# Patient Record
Sex: Female | Born: 1985 | Race: White | Hispanic: No | Marital: Married | Smoking: Current every day smoker
Health system: Southern US, Community
[De-identification: ages and names within clinical notes are randomized; demographics above are authoritative.]

## PROBLEM LIST (undated history)

## (undated) DIAGNOSIS — J45909 Unspecified asthma, uncomplicated: Secondary | ICD-10-CM

## (undated) DIAGNOSIS — N83209 Unspecified ovarian cyst, unspecified side: Secondary | ICD-10-CM

## (undated) DIAGNOSIS — F32A Depression, unspecified: Secondary | ICD-10-CM

## (undated) DIAGNOSIS — E282 Polycystic ovarian syndrome: Secondary | ICD-10-CM

## (undated) DIAGNOSIS — E669 Obesity, unspecified: Secondary | ICD-10-CM

## (undated) DIAGNOSIS — G43909 Migraine, unspecified, not intractable, without status migrainosus: Secondary | ICD-10-CM

## (undated) DIAGNOSIS — Z720 Tobacco use: Secondary | ICD-10-CM

## (undated) HISTORY — DX: Unspecified ovarian cyst, unspecified side: N83.209

## (undated) HISTORY — DX: Unspecified asthma, uncomplicated: J45.909

## (undated) HISTORY — DX: Obesity, unspecified: E66.9

## (undated) HISTORY — DX: Migraine, unspecified, not intractable, without status migrainosus: G43.909

## (undated) HISTORY — DX: Tobacco use: Z72.0

## (undated) HISTORY — DX: Polycystic ovarian syndrome: E28.2

## (undated) HISTORY — DX: Depression, unspecified: F32.A

---
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
## 2006-09-20 DIAGNOSIS — F329 Major depressive disorder, single episode, unspecified: Secondary | ICD-10-CM | POA: Insufficient documentation

## 2006-09-20 DIAGNOSIS — N83209 Unspecified ovarian cyst, unspecified side: Secondary | ICD-10-CM | POA: Insufficient documentation

## 2006-09-20 DIAGNOSIS — F3289 Other specified depressive episodes: Secondary | ICD-10-CM | POA: Insufficient documentation

## 2006-09-20 DIAGNOSIS — J309 Allergic rhinitis, unspecified: Secondary | ICD-10-CM | POA: Insufficient documentation

## 2006-09-20 DIAGNOSIS — J45909 Unspecified asthma, uncomplicated: Secondary | ICD-10-CM | POA: Insufficient documentation

## 2006-09-20 DIAGNOSIS — E669 Obesity, unspecified: Secondary | ICD-10-CM | POA: Insufficient documentation

## 2006-09-20 DIAGNOSIS — G43909 Migraine, unspecified, not intractable, without status migrainosus: Secondary | ICD-10-CM | POA: Insufficient documentation

## 2006-09-20 DIAGNOSIS — F17200 Nicotine dependence, unspecified, uncomplicated: Secondary | ICD-10-CM | POA: Insufficient documentation

## 2006-09-21 ENCOUNTER — Encounter (INDEPENDENT_AMBULATORY_CARE_PROVIDER_SITE_OTHER): Payer: Self-pay | Admitting: *Deleted

## 2006-11-13 ENCOUNTER — Emergency Department (HOSPITAL_COMMUNITY): Admission: EM | Admit: 2006-11-13 | Discharge: 2006-11-13 | Payer: Self-pay | Admitting: Emergency Medicine

## 2007-01-15 ENCOUNTER — Emergency Department (HOSPITAL_COMMUNITY): Admission: EM | Admit: 2007-01-15 | Discharge: 2007-01-15 | Payer: Self-pay | Admitting: Family Medicine

## 2007-02-27 ENCOUNTER — Emergency Department (HOSPITAL_COMMUNITY): Admission: EM | Admit: 2007-02-27 | Discharge: 2007-02-27 | Payer: Self-pay | Admitting: Family Medicine

## 2007-07-20 ENCOUNTER — Emergency Department (HOSPITAL_COMMUNITY): Admission: EM | Admit: 2007-07-20 | Discharge: 2007-07-20 | Payer: Self-pay | Admitting: Emergency Medicine

## 2007-07-25 ENCOUNTER — Emergency Department (HOSPITAL_COMMUNITY): Admission: EM | Admit: 2007-07-25 | Discharge: 2007-07-25 | Payer: Self-pay | Admitting: Emergency Medicine

## 2007-07-29 ENCOUNTER — Emergency Department (HOSPITAL_COMMUNITY): Admission: EM | Admit: 2007-07-29 | Discharge: 2007-07-29 | Payer: Self-pay | Admitting: Emergency Medicine

## 2007-08-28 ENCOUNTER — Ambulatory Visit: Payer: Self-pay | Admitting: Cardiology

## 2007-09-13 ENCOUNTER — Emergency Department (HOSPITAL_COMMUNITY): Admission: EM | Admit: 2007-09-13 | Discharge: 2007-09-13 | Payer: Self-pay | Admitting: Family Medicine

## 2007-09-18 ENCOUNTER — Ambulatory Visit: Payer: Self-pay | Admitting: Cardiology

## 2007-09-18 ENCOUNTER — Ambulatory Visit: Payer: Self-pay

## 2007-09-18 ENCOUNTER — Encounter: Payer: Self-pay | Admitting: Cardiology

## 2009-05-29 ENCOUNTER — Emergency Department (HOSPITAL_COMMUNITY): Admission: EM | Admit: 2009-05-29 | Discharge: 2009-05-29 | Payer: Self-pay | Admitting: Family Medicine

## 2009-08-19 ENCOUNTER — Emergency Department (HOSPITAL_COMMUNITY): Admission: EM | Admit: 2009-08-19 | Discharge: 2009-08-19 | Payer: Self-pay | Admitting: Emergency Medicine

## 2009-11-16 ENCOUNTER — Emergency Department: Payer: Self-pay | Admitting: Emergency Medicine

## 2010-10-26 LAB — POCT PREGNANCY, URINE: Preg Test, Ur: NEGATIVE

## 2010-10-26 LAB — WET PREP, GENITAL
WBC, Wet Prep HPF POC: NONE SEEN
Yeast Wet Prep HPF POC: NONE SEEN

## 2010-10-26 LAB — POCT URINALYSIS DIP (DEVICE)
Bilirubin Urine: NEGATIVE
Glucose, UA: NEGATIVE mg/dL
Ketones, ur: NEGATIVE mg/dL
pH: 6.5 (ref 5.0–8.0)

## 2010-10-26 LAB — GC/CHLAMYDIA PROBE AMP, GENITAL
Chlamydia, DNA Probe: NEGATIVE
GC Probe Amp, Genital: NEGATIVE

## 2010-12-06 NOTE — Letter (Signed)
August 28, 2007    Estanislado Pandy, MD  [facility]  [address]  [place], [place] [address]   RE:  ARELLY, WHITTENBERG  MRN:  [id number]  /  DOB:  1986-01-01   Dear Dr. Nash Shearer:   Thank you for your referral of Ms. Lizaola.  As you know, she is a  pleasant 25 year old woman with a reported history of seizures and also  migraine headaches.  She has had 2 fairly recent spells, possibly  seizures, although the diagnosis remains in question, and was referred  for an EEG which was done back in January.  This study revealed no  obvious seizure-like activity.  She was noted on electrocardiographic  tracing to have occasional premature ventricular complexes with what I  presume to be compensatory pauses described as being 1-2 seconds.  She  is referred today to discuss the situation further.   In speaking with the patient in some detail about her events, she seems  to indicate that she has had episodes of fainting even back as early as  age 16.  She remembers an episode where she passed out in a doctor's  office during a blood draw at age 80, and subsequent episode at age 7  when she was standing at work.  She remembers 4 episodes subsequent to  that, the first of which occurred several years ago when she was in a  hospital getting blood drawn.  A second episode occurred when she was  standing in a parking lot speaking with someone and felt lightheaded.  The third episode occurred most recently when she was in the bathroom  seated changing a tampon and noted that her IUD came out.  She was  shocked when she saw this and subsequently began to feel very warm and  flushed all over, subsequently disoriented and lightheaded, and  apparently passed out.  She reports similar feelings with her other  episodes always describing a feeling of warmth followed by  lightheadedness, and also since her heart speed up at this time.  The  most recent episode occurred apparently  when she was lying down in bed  and she does not remember much about the details.  She otherwise has no  exertional chest pain or breathlessness, and no major sense of  palpitations.  Electrocardiogram shows an RSR prime (incomplete right  bundle branch block) and otherwise nonspecific T-wave changes.  There is  no old tracing for comparison.   The patient has no drug allergies.   She is on no regular medications with the exception of meloxicam 7.5 mg  1-2 tablets p.o. p.r.n.   PAST HISTORY:  As outlined above.  She has a cesarean section and also  remotely tonsillectomy.   SOCIAL HISTORY:  The patient is single.  She has two children.  She  works as a Public librarian.  She has a half-pack per day tobacco use  history approximately 10 years.  Denies any recreational drug use or  alcohol use.  Does drink one caffeinated beverage daily.   FAMILY HISTORY:  Reviewed and noncontributory for premature  cardiovascular disease or sudden cardiac death.   REVIEW OF SYSTEMS:  As outlined above.  Otherwise negative.   EXAMINATION:  The patient is comfortable in no acute distress.  Weight is 176 pounds, blood pressure 124/74, heart rate 77.  She is an overweight woman no acute distress.  HEENT:  Conjunctiva lids normal.  Pharynx clear.  Neck is supple.  No  elevated was pressure no loud bruits or thyromegaly.  LUNGS:  Clear.  Without labored breathing at rest.  CARDIOVASCULAR:  Regular rate and rhythm.  Soft probable functional  cardiac murmur at the base, normal second heart sound.  No S3 gallop or  pericardial rub.  ABDOMEN:  Soft.  Nontender.  Normoactive bowel sounds.  EXTREMITIES:  No pitting edema.  Pulses 2+.  SKIN:  Warm and dry.  MUSCULOSKELETAL:  No kyphosis noted.  Neuropsychiatric the patient is alert x3.  Affect is appropriate.   IMPRESSION/RECOMMENDATIONS:  Ms. Eastland is a pleasant 25 year old woman  describing spells over time that actually seen fairly consistent with   neurocardiogenic syncope.  It is entirely possible that she has with  these fainting spells subsequent seizure-like activity associated with  low blood pressure and decreased cerebral perfusion.  Most episodes seem  consistent with this with the exception of the most recent one when she  was supine without obvious trigger.  I suspect that the PVCs noted  during electro-encephalopathy may be clinically insignificant and not  related to the present situation.  She has a probable functional cardiac  murmur.  Electrocardiogram does show an incomplete right bundle branch  block pattern which may well be a normal variant.  She had no other  cardiac evaluation.  Today we spoke about neurocardiogenic syncope and  importance of maintaining a well-hydrated status, and trying to achieve  a supine position if she feels symptoms starting.  I would like to  provide an event recorder to make sure that there are no specific  dysrhythmias of concern and also a basic 2-D echocardiogram to exclude  cardiac structural abnormalities.  I think that if these are both  reassuring she more than likely has neurocardiogenic syncope which can  be managed with conservative measures.  I did not feel strongly about  medications such as beta blockers or selective serotonin reuptake  inhibitors at this point, although if she has progressive recurrent  symptoms, these could be considered.  A tilt table test could also be  considered although the patient's history is fairly suggestive in and of  itself.  I will have her return to the office to discuss results were  testing.    Sincerely,      Jonelle Sidle, MD  Electronically Signed    SGM/MedQ  DD: 08/28/2007  DT: 08/29/2007  Job #: [id number]

## 2010-12-09 NOTE — Consult Note (Signed)
[place]  Patient:    Meghan Spencer, Meghan Spencer Visit Number: [id number] MRN: [id number]          Service Type: Attending:  Kristen Loader, M.D. Dictated by:   Kristen Loader, M.D. Proc. Date: 12/24/01                            Consultation Report  HISTORY OF PRESENT ILLNESS:  The patient is a 25 year old white female, nulligravida, who has come in after multiple visits to other institutions. Her last sonogram was a year ago and she went to another clinic for pelvic pain where they diagnosed ovarian cyst and a possible paraovarian cyst.  The patient has a long history of migraine headaches, specifically on the right side that often cause her to miss school for three days at a time.  She also has a long history of upper respiratory problems, allergic rhinitis, and congestion.  She is currently taking Zyrtec, Entex, and occasionally takes Imitrex.  She is on Depo-Provera for contraception and has for short periods taken oral contraceptives in the past.  PHYSICAL EXAMINATION:  VITAL SIGNS:  She had a blood pressure of 115/62, pulse of 87 per minute, temperature was 98.8.  Her weight is 199 pounds.  GENERAL:  She is a well-developed, well-nourished white female in no apparent acute distress.  HEENT:  Within normal limits.  The ear drums did not show any signs of infection, although the patient has been complaining of earaches recently.  NECK:  Supple.  No masses.  Thyroid normal.  LUNGS:  Clear to auscultation and percussion.  HEART:  No murmur.  Normal sinus rhythm.  ABDOMEN:  Soft, flat, nontender.  No masses or organomegaly.  GENITALIA:  Within normal limits.  EXTREMITIES:  Normal.  DISPOSITION:  I had a long consultation with the patient and her mother concerning her migraines in which we will represcribe the Imitrex.  She has often gone to the hospital for Imitrex injections for rescue.  We will give her a prescription for that.  If she  seems to do well on the Zyrtec for allergies when she takes it regularly, but she has not normally been taking it regularly.  We will renew her prescription for the Entex [place].  The main concern is the patients pelvic masses which have not been evaluated since September 2002 and we will repeat the pelvic sonogram and decide on the appropriate treatment by that time.  The patient is due for a Depo-Provera in two weeks and we will have her return for that visit.  IMPRESSION: 1. Pelvic masses and pelvic pain, chronic and intermittent. 2. Frequent migraine headaches two x every month. 3. Allergic rhinitis. 4. Allergic congestion. Dictated by:   Kristen Loader, M.D. Attending:  Kristen Loader, M.D. DD:  12/24/01 TD:  12/25/01 Job: [id number]

## 2011-04-14 LAB — WET PREP, GENITAL
Trich, Wet Prep: NONE SEEN
Yeast Wet Prep HPF POC: NONE SEEN

## 2011-04-14 LAB — POCT URINALYSIS DIP (DEVICE)
Bilirubin Urine: NEGATIVE
Ketones, ur: NEGATIVE
Protein, ur: NEGATIVE
Specific Gravity, Urine: 1.01
pH: 6.5

## 2011-04-14 LAB — POCT PREGNANCY, URINE: Operator id: 116391

## 2011-04-28 LAB — CBC
MCHC: 34
MCV: 88.9
Platelets: 271
RBC: 4.19
WBC: 10.7 — ABNORMAL HIGH

## 2011-04-28 LAB — DIFFERENTIAL
Basophils Relative: 0
Eosinophils Absolute: 0.1
Neutro Abs: 7.2
Neutrophils Relative %: 68

## 2011-04-28 LAB — I-STAT 8, (EC8 V) (CONVERTED LAB)
Acid-Base Excess: 1
Bicarbonate: 23.7
Chloride: 108
TCO2: 25
pCO2, Ven: 30.4 — ABNORMAL LOW
pH, Ven: 7.5 — ABNORMAL HIGH

## 2011-04-28 LAB — URINALYSIS, ROUTINE W REFLEX MICROSCOPIC
Bilirubin Urine: NEGATIVE
Ketones, ur: NEGATIVE
Leukocytes, UA: NEGATIVE
Nitrite: NEGATIVE
Protein, ur: NEGATIVE
Urobilinogen, UA: 0.2

## 2011-04-28 LAB — POCT PREGNANCY, URINE
Operator id: 26520
Preg Test, Ur: NEGATIVE

## 2011-04-28 LAB — WET PREP, GENITAL
Clue Cells Wet Prep HPF POC: NONE SEEN
Trich, Wet Prep: NONE SEEN

## 2011-04-28 LAB — POCT I-STAT CREATININE
Creatinine, Ser: 0.7
Operator id: 257131

## 2011-05-08 LAB — POCT URINALYSIS DIP (DEVICE)
Bilirubin Urine: NEGATIVE
Glucose, UA: NEGATIVE
Hgb urine dipstick: NEGATIVE
Ketones, ur: NEGATIVE
Nitrite: NEGATIVE

## 2011-05-08 LAB — WET PREP, GENITAL
Clue Cells Wet Prep HPF POC: NONE SEEN
Trich, Wet Prep: NONE SEEN

## 2011-05-08 LAB — GC/CHLAMYDIA PROBE AMP, GENITAL: Chlamydia, DNA Probe: NEGATIVE

## 2011-05-08 LAB — POCT PREGNANCY, URINE: Preg Test, Ur: NEGATIVE

## 2011-07-19 ENCOUNTER — Encounter: Payer: Self-pay | Admitting: Emergency Medicine

## 2011-07-19 ENCOUNTER — Emergency Department (INDEPENDENT_AMBULATORY_CARE_PROVIDER_SITE_OTHER)
Admission: EM | Admit: 2011-07-19 | Discharge: 2011-07-19 | Disposition: A | Discharge status: Home / Self Care | Payer: Self-pay | Source: Home / Self Care | Attending: Family Medicine | Admitting: Family Medicine

## 2011-07-19 DIAGNOSIS — J020 Streptococcal pharyngitis: Secondary | ICD-10-CM

## 2011-07-19 MED ORDER — IBUPROFEN 600 MG PO TABS: 600.0000 mg | ORAL_TABLET | Freq: Three times a day (TID) | ORAL | Status: AC | PRN

## 2011-07-19 MED ORDER — HYDROCODONE-ACETAMINOPHEN 5-325 MG PO TABS
ORAL_TABLET | ORAL | Status: AC
  Filled 2011-07-19: qty 2

## 2011-07-19 MED ORDER — AMOXICILLIN 500 MG PO CAPS: 500.0000 mg | ORAL_CAPSULE | Freq: Three times a day (TID) | ORAL | Status: AC

## 2011-07-19 MED ORDER — ONDANSETRON HCL 4 MG PO TABS: 4.0000 mg | ORAL_TABLET | Freq: Three times a day (TID) | ORAL | Status: AC | PRN

## 2011-07-19 MED ORDER — HYDROCODONE-ACETAMINOPHEN 5-325 MG PO TABS
1.0000 | ORAL_TABLET | Freq: Once | ORAL | Status: AC
  Administered 2011-07-19: 1 via ORAL

## 2011-07-19 MED ORDER — CEFTRIAXONE SODIUM 1 G IJ SOLR
INTRAMUSCULAR | Status: AC
  Filled 2011-07-19: qty 10

## 2011-07-19 MED ORDER — PREDNISONE 20 MG PO TABS: ORAL_TABLET | ORAL | Status: DC

## 2011-07-19 MED ORDER — HYDROCODONE-ACETAMINOPHEN 7.5-325 MG/15ML PO SOLN: 15.0000 mL | Freq: Three times a day (TID) | ORAL | Status: AC | PRN

## 2011-07-19 MED ORDER — CEFTRIAXONE SODIUM 1 G IJ SOLR
1.0000 g | Freq: Once | INTRAMUSCULAR | Status: AC
  Administered 2011-07-19: 1 g via INTRAMUSCULAR

## 2011-07-19 NOTE — ED Notes (Signed)
PT HERE WITH SORE THROAT,PAIN WITH SWALLOWING AND INFLAMED UVULA THAT STARTED ON Monday.PT STATES SX HAS PROGRESSED WITH FLU LIKE SX BODY ACHES,CHILLS UNRELIEVED BY SUDAFED,DAYQUIL AND TYLENOL.TEMP 99.2

## 2011-07-19 NOTE — ED Provider Notes (Signed)
History     CSN: [id number]  Arrival date & time 07/19/11  1642   First MD Initiated Contact with Patient 07/19/11 1706      Chief Complaint  Patient presents with  . Sore Throat  . Influenza    (Consider location/radiation/quality/duration/timing/severity/associated sxs/prior treatment) HPI Comments: 25 y/o female smoker here c/o sore throat, congestion, chills, body aches x 3 days. Pain with swallowing, husband with flu symptoms last week. Denies chest pain, or shortness of breath. Nausea, no vomiting or diarrhea, no rash or abdominal pain.   History reviewed. No pertinent past medical history.  Past Surgical History  Procedure Date  . Cesarean section     History reviewed. No pertinent family history.  History  Substance Use Topics  . Smoking status: Current Everyday Smoker  . Smokeless tobacco: Not on file  . Alcohol Use: No    OB History    Grav Para Term Preterm Abortions TAB SAB Ect Mult Living                  Review of Systems  Constitutional: Positive for fever and chills.  HENT: Positive for congestion, sore throat, trouble swallowing and voice change. Negative for ear pain, neck pain and neck stiffness.   Respiratory: Negative for chest tightness and shortness of breath.   Gastrointestinal: Positive for nausea. Negative for vomiting and abdominal pain.  Musculoskeletal: Positive for myalgias and arthralgias.  Skin: Negative for rash.  Neurological: Positive for headaches.    Allergies  Review of patient's allergies indicates no known allergies.  Home Medications   Current Outpatient Rx  Name Route Sig Dispense Refill  . AMOXICILLIN 500 MG PO CAPS Oral Take 1 capsule (500 mg total) by mouth 3 (three) times daily. 21 capsule 0  . HYDROCODONE-ACETAMINOPHEN 7.5-325 MG/15ML PO SOLN Oral Take 15 mLs by mouth every 8 (eight) hours as needed for pain. 120 mL 0  . IBUPROFEN 600 MG PO TABS Oral Take 1 tablet (600 mg total) by mouth every 8 (eight) hours  as needed for pain or fever. 30 tablet 0  . ONDANSETRON HCL 4 MG PO TABS Oral Take 1 tablet (4 mg total) by mouth every 8 (eight) hours as needed for nausea. 10 tablet 0  . PREDNISONE 20 MG PO TABS  2 tabs po daily for 5 days 10 tablet no    BP 129/86  Pulse 100  Temp(Src) 99.2 F (37.3 C) (Tympanic)  Resp 18  SpO2 99%  Physical Exam  Nursing note and vitals reviewed. Constitutional: She is oriented to person, place, and time. She appears well-developed and well-nourished. No distress.  HENT:  Head: Normocephalic and atraumatic.  Right Ear: External ear normal.  Left Ear: External ear normal.       Nasal Congestion with erythema and swelling of nasal turbinates, clear rhinorrhea. Pharyngeal erythema with exudates. No uvula deviation but moderate uvula edema and exudates. No trismus. patent airway. TM's with increased vascular markings and some dullness bilaterally no swelling or bulging   Eyes: Conjunctivae and EOM are normal. Pupils are equal, round, and reactive to light.  Neck: Neck supple.  Cardiovascular: Normal rate, regular rhythm and normal heart sounds.   Pulmonary/Chest: Effort normal and breath sounds normal. No respiratory distress. She has no wheezes. She has no rales. She exhibits no tenderness.  Abdominal: Soft. She exhibits no distension. There is no tenderness.  Lymphadenopathy:    She has cervical adenopathy.  Neurological: She is alert and oriented to person, place,  and time.  Skin: No rash noted.    ED Course  Procedures (including critical care time)  Labs Reviewed  POCT RAPID STREP A ([facility] ONLY) - Abnormal; Notable for the following:    Streptococcus, Group A Screen (Direct) POSITIVE (*)    All other components within normal limits  LAB REPORT - SCANNED   No results found.   1. Streptococcal pharyngitis       MDM  Strep throat with uvulitis. Prescribed prednisone, amoxicillin, hydrocodone, ibuprofen and zofran with close follow up.  Encouraged to quit smoking!        Sharin Grave, MD 07/21/11 1156

## 2011-07-24 ENCOUNTER — Emergency Department (HOSPITAL_COMMUNITY)
Admission: EM | Admit: 2011-07-24 | Discharge: 2011-07-24 | Disposition: A | Discharge status: Home / Self Care | Payer: Self-pay | Attending: Emergency Medicine | Admitting: Emergency Medicine

## 2011-07-24 ENCOUNTER — Encounter (HOSPITAL_COMMUNITY): Payer: Self-pay | Admitting: Emergency Medicine

## 2011-07-24 ENCOUNTER — Emergency Department (HOSPITAL_COMMUNITY): Payer: Self-pay

## 2011-07-24 DIAGNOSIS — F17200 Nicotine dependence, unspecified, uncomplicated: Secondary | ICD-10-CM | POA: Insufficient documentation

## 2011-07-24 DIAGNOSIS — H9209 Otalgia, unspecified ear: Secondary | ICD-10-CM | POA: Insufficient documentation

## 2011-07-24 DIAGNOSIS — J020 Streptococcal pharyngitis: Secondary | ICD-10-CM | POA: Insufficient documentation

## 2011-07-24 DIAGNOSIS — R220 Localized swelling, mass and lump, head: Secondary | ICD-10-CM | POA: Insufficient documentation

## 2011-07-24 DIAGNOSIS — R509 Fever, unspecified: Secondary | ICD-10-CM | POA: Insufficient documentation

## 2011-07-24 DIAGNOSIS — H9201 Otalgia, right ear: Secondary | ICD-10-CM

## 2011-07-24 DIAGNOSIS — Z79899 Other long term (current) drug therapy: Secondary | ICD-10-CM | POA: Insufficient documentation

## 2011-07-24 LAB — POCT I-STAT, CHEM 8
BUN: 8 mg/dL (ref 6–23)
Calcium, Ion: 1.15 mmol/L (ref 1.12–1.32)
HCT: 37 % (ref 36.0–46.0)
Hemoglobin: 12.6 g/dL (ref 12.0–15.0)
Sodium: 138 mEq/L (ref 135–145)
TCO2: 29 mmol/L (ref 0–100)

## 2011-07-24 LAB — MONONUCLEOSIS SCREEN: Mono Screen: NEGATIVE

## 2011-07-24 MED ORDER — SODIUM CHLORIDE 0.9 % IV BOLUS (SEPSIS)
1000.0000 mL | Freq: Once | INTRAVENOUS | Status: AC
  Administered 2011-07-24: 1000 mL via INTRAVENOUS

## 2011-07-24 MED ORDER — MORPHINE SULFATE 4 MG/ML IJ SOLN
4.0000 mg | Freq: Once | INTRAMUSCULAR | Status: DC
  Filled 2011-07-24: qty 1

## 2011-07-24 MED ORDER — IBUPROFEN 800 MG PO TABS: 800.0000 mg | ORAL_TABLET | Freq: Three times a day (TID) | ORAL | Status: DC

## 2011-07-24 MED ORDER — CEPHALEXIN 500 MG PO CAPS: 500.0000 mg | ORAL_CAPSULE | Freq: Four times a day (QID) | ORAL | Status: DC

## 2011-07-24 MED ORDER — PREDNISONE (PAK) 10 MG PO TABS: 10.0000 mg | ORAL_TABLET | Freq: Every day | ORAL | Status: AC

## 2011-07-24 MED ORDER — PSEUDOEPHEDRINE HCL 60 MG PO TABS
60.0000 mg | ORAL_TABLET | ORAL | Status: AC
  Administered 2011-07-24: 60 mg via ORAL
  Filled 2011-07-24: qty 1

## 2011-07-24 MED ORDER — IBUPROFEN 800 MG PO TABS: 800.0000 mg | ORAL_TABLET | Freq: Three times a day (TID) | ORAL | Status: AC

## 2011-07-24 MED ORDER — METHYLPREDNISOLONE SODIUM SUCC 125 MG IJ SOLR
125.0000 mg | Freq: Once | INTRAMUSCULAR | Status: AC
  Administered 2011-07-24: 125 mg via INTRAVENOUS
  Filled 2011-07-24: qty 2

## 2011-07-24 MED ORDER — SODIUM CHLORIDE 0.9 % IV BOLUS (SEPSIS): 1000.0000 mL | Freq: Once | INTRAVENOUS | Status: DC

## 2011-07-24 MED ORDER — HYDROMORPHONE HCL PF 1 MG/ML IJ SOLN
1.0000 mg | Freq: Once | INTRAMUSCULAR | Status: AC
  Administered 2011-07-24: 1 mg via INTRAVENOUS
  Filled 2011-07-24: qty 1

## 2011-07-24 MED ORDER — ACETAMINOPHEN 325 MG PO TABS
650.0000 mg | ORAL_TABLET | Freq: Once | ORAL | Status: AC
  Administered 2011-07-24: 650 mg via ORAL
  Filled 2011-07-24: qty 2

## 2011-07-24 MED ORDER — PERCOCET 5-325 MG PO TABS: 1.0000 | ORAL_TABLET | Freq: Four times a day (QID) | ORAL | Status: AC | PRN

## 2011-07-24 MED ORDER — IOHEXOL 300 MG/ML  SOLN
75.0000 mL | Freq: Once | INTRAMUSCULAR | Status: AC | PRN
  Administered 2011-07-24: 75 mL via INTRAVENOUS

## 2011-07-24 MED ORDER — CEPHALEXIN 500 MG PO CAPS: 500.0000 mg | ORAL_CAPSULE | Freq: Four times a day (QID) | ORAL | Status: AC

## 2011-07-24 MED ORDER — ONDANSETRON HCL 4 MG/2ML IJ SOLN
4.0000 mg | Freq: Once | INTRAMUSCULAR | Status: AC
  Administered 2011-07-24: 4 mg via INTRAVENOUS
  Filled 2011-07-24: qty 2

## 2011-07-24 NOTE — ED Provider Notes (Signed)
History     CSN: [id number]  Arrival date & time 07/24/11  4098   First MD Initiated Contact with Patient 07/24/11 [phone]      Chief Complaint  Patient presents with  . Otalgia    (Consider location/radiation/quality/duration/timing/severity/associated sxs/prior treatment) HPI Comments: Patient reports right sided throat and right ear pain.  Patient has been sick for approximately 8 days, was seen at urgent care 5 days ago and diagnosed with strep pharyngitis and uvulitis.  Patient has been taking amoxicillin and prednisone.  Last night, patient's fevers returned to 101 and she developed severe pain in her right ear and right throat. Had one episode of N/V upon arrival to ED.  Denies SOB, difficulty swallowing.    Patient is a 25 y.o. female presenting with ear pain. The history is provided by the patient.  Otalgia Pertinent negatives include no abdominal pain and no diarrhea.    History reviewed. No pertinent past medical history.  Past Surgical History  Procedure Date  . Cesarean section   . Cesarean section     History reviewed. No pertinent family history.  History  Substance Use Topics  . Smoking status: Current Everyday Smoker  . Smokeless tobacco: Not on file  . Alcohol Use: No    OB History    Grav Para Term Preterm Abortions TAB SAB Ect Mult Living                  Review of Systems  HENT: Positive for ear pain.   Gastrointestinal: Negative for abdominal pain and diarrhea.  Genitourinary: Negative for dysuria and menstrual problem.  All other systems reviewed and are negative.    Allergies  Review of patient's allergies indicates no known allergies.  Home Medications   Current Outpatient Rx  Name Route Sig Dispense Refill  . AMOXICILLIN 500 MG PO CAPS Oral Take 1 capsule (500 mg total) by mouth 3 (three) times daily. 21 capsule 0  . IBUPROFEN 600 MG PO TABS Oral Take 1 tablet (600 mg total) by mouth every 8 (eight) hours as needed for pain or  fever. 30 tablet 0  . PSEUDOEPHEDRINE HCL 240 MG PO TB24 Oral Take 1 tablet by mouth daily.      Marland Kitchen HYDROCODONE-ACETAMINOPHEN 7.5-325 MG/15ML PO SOLN Oral Take 15 mLs by mouth every 8 (eight) hours as needed for pain. 120 mL 0  . ONDANSETRON HCL 4 MG PO TABS Oral Take 1 tablet (4 mg total) by mouth every 8 (eight) hours as needed for nausea. 10 tablet 0    BP 138/102  Pulse 110  Temp(Src) 101.8 F (38.8 C) (Oral)  Resp 20  SpO2 98%  Physical Exam  Constitutional: She is oriented to person, place, and time. She appears well-developed and well-nourished.  HENT:  Head: Normocephalic and atraumatic.  Right Ear: Tympanic membrane is bulging.  Left Ear: Tympanic membrane normal.  Mouth/Throat: Posterior oropharyngeal edema present.       Asymmetry of soft palate, R>L  Neck: Neck supple.  Cardiovascular: Normal rate, regular rhythm and normal heart sounds.   Pulmonary/Chest: Breath sounds normal. No respiratory distress. She has no wheezes. She has no rales. She exhibits no tenderness.  Abdominal: Soft. Bowel sounds are normal. There is no tenderness.  Neurological: She is alert and oriented to person, place, and time.    ED Course  Procedures (including critical care time)  Labs Reviewed  POCT I-STAT, CHEM 8 - Abnormal; Notable for the following:    Glucose, Bld 103 (*)  All other components within normal limits  MONONUCLEOSIS SCREEN  I-STAT, CHEM 8   Ct Soft Tissue Neck W Contrast  07/24/2011  *RADIOLOGY REPORT*  Clinical Data: Pharyngitis.  Positive strep test.  Neck swelling.  CT NECK WITH CONTRAST  Technique:  Multidetector CT imaging of the neck was performed with intravenous contrast.  Contrast: 75mL OMNIPAQUE IOHEXOL 300 MG/ML IV SOLN  Comparison: None.  Findings: Suprahyoid neck:  Bilateral nasopharyngeal adenoidal hypertrophy.  There is eustachian tube dysfunction with bilateral mastoid fluid without obvious coalescence.  No acute sinus fluid. Mild lingual tonsillar  enlargement.  No retropharyngeal or parapharyngeal abscess.  Normal major and minor salivary glands. Moderate edema of the uvula.  No mucosal lesion of the pharynx or tongue.  No superficial cellulitis.  Larynx:  Normal.  Infrahyoid neck:  Normal thyroid.  No infrahyoid masses.  Lymph nodes:  Widespread bilateral level II lymph nodes, some which have a short axis diameter greater than 1 cm (images 49 and 50 of series 3).  No necrosis.  Smaller level III, level IV, level V nodes also appear relatively symmetric.  The findings are most consistent with an inflammatory or reactive disorder, such as strep pharyngitis, or mononucleosis.  It is not possible to exclude neoplastic process such as lymphoma.  Correlate with clinical findings elsewhere.  Upper chest/mediastinum:  No visible upper mediastinal lymph nodes or masses.  Respiratory motion precludes careful evaluation of the lung apices but there is no visible mass.  There is abundant mediastinal fat.  The arch and great vessels are grossly unremarkable.  Additional:  Anatomic alignment of the cervical spine without spondylosis.  No osseous abnormality.  Intracranial compartment appears unremarkable.  IMPRESSION: Bilateral nasopharyngeal adenoidal hypertrophy without retropharyngeal or parapharyngeal abscess.  Edematous uvula. Bilateral mastoid effusions.  Generalized bilateral level II lymphadenopathy.  Findings most consistent with an inflammatory disorder.  See comments above.  In the appropriate clinical setting, lymphoma could have a several appearance.  Original Report Authenticated By: Elsie Stain, M.D.   9:32 AM Patient reports improvement of pain.  Discussed results and plan with patient.  Patient requests cheaper medications if possible because she does not have health insurance.     1. Strep pharyngitis       MDM  Patient with known strep pharyngitis presents to ED with worsening throat pain, right ear pain, and fever.  CT shows  lymphadenopathy and dysfunctioning eustachian tube, likely due to pharyngeal edema.  Patient has been taking amoxicillin, is out of prednisone.  I have given patient instructions to continue amoxicillin, have added keflex for additional coverage (pt requested antibiotic must be $4 list), prednisone, pain medication.  Education given to maintain hydration, continue decongestants, return for worsening symptoms.   Pt verbalizes understanding and agrees with plan.          Rise Patience, [place] 07/24/11 1044

## 2011-07-24 NOTE — ED Notes (Signed)
Right tympanic membrane appears to be bulging.

## 2011-07-24 NOTE — ED Notes (Signed)
Right tympanic membrane

## 2011-07-24 NOTE — ED Notes (Signed)
Pt transported to CT ?

## 2011-07-24 NOTE — ED Notes (Signed)
Pt states she was diagnosed with strep Wednesday.  Not getting better and throat is very swollen.  Pain in right ear.

## 2011-07-24 NOTE — ED Notes (Signed)
Pt states she went to Urgent Care on this past Wednesday for strep and was prescribed amoxicillin.  Pt stated she has not been getting better and started having ear pain last night.

## 2011-07-25 NOTE — ED Provider Notes (Signed)
Medical screening examination/treatment/procedure(s) were performed by non-physician practitioner and as supervising physician I was immediately available for consultation/collaboration.   Ashwin Tibbs W Konya Fauble, MD 07/25/11 0719 

## 2013-10-23 ENCOUNTER — Ambulatory Visit: Payer: Self-pay | Admitting: Obstetrics & Gynecology

## 2013-10-23 LAB — HCG, QUANTITATIVE, PREGNANCY: Beta Hcg, Quant.: <1 m[IU]/mL — ABNORMAL LOW

## 2014-12-28 ENCOUNTER — Ambulatory Visit: Payer: BC Managed Care – PPO | Admitting: Physical Therapy

## 2015-01-05 ENCOUNTER — Ambulatory Visit: Payer: BC Managed Care – PPO | Attending: Obstetrics and Gynecology | Admitting: Physical Therapy

## 2017-02-12 ENCOUNTER — Other Ambulatory Visit: Payer: Self-pay | Admitting: Nurse Practitioner

## 2017-02-12 DIAGNOSIS — N939 Abnormal uterine and vaginal bleeding, unspecified: Secondary | ICD-10-CM

## 2017-02-14 ENCOUNTER — Ambulatory Visit: Payer: BC Managed Care – PPO

## 2020-02-03 NOTE — Progress Notes (Signed)
02/04/2020 9:41 AM   Meghan Spencer 1985-08-19 [id number]  Referring provider: Fayrene Helper, NP [address] [place],  [place] [address] Chief Complaint  Patient presents with  . Hematuria    HPI: Meghan Spencer is a 34 y.o. female with hematuria who is seen for an evaluation and management. She is accompanied by her husband today.  She had a follow up with Orson Eva, NP on 01/19/2020. She had left sided flank pain radiating to her left buttock and down her left leg x 1 week. She has associated numbness and tingling. During the visit she noted that the flank pain had lessened. She was not on her menstrual cycle at the time. She did ave sexual activity 2 days prior to appointment. She has chronic lower back pain, seeing chiropractor.   UA showed 2+ hematuria on dipstick. No microscopic examination was provided.  CT A/P without contrast on 01/20/2020 showed hepatic steatosis. Umbilical hernia. Normal kidneys. Report is available in referral but I am unable to access imaging elsewhere.   She is a current smoker. Patient smokes 1-2 cigarettes a day.  Her grandmother had liver cancer. No other family history of cancer.   Today she feels terrible. Left sided flank pain and back pain is worse.   It continues to radiate down her leg.  She recently started prednisone which made it feel a lot better.  She reports that this is fairly typical of her back pain issues.  She has a history of PCOS. She has some discomfort from time to time.    PMH: Past Medical History:  Diagnosis Date  . Asthma   . Depression   . Migraine   . Obesity   . Ovarian cyst   . PCOS (polycystic ovarian syndrome)   . Tobacco use     Surgical History: Past Surgical History:  Procedure Laterality Date  . CESAREAN SECTION    . CESAREAN SECTION      Home Medications:  Allergies as of 02/04/2020      Reactions   Morphine       Medication List       Accurate as of February 04, 2020  9:41  AM. If you have any questions, ask your nurse or doctor.        STOP taking these medications   SUDAFED 24 HOUR NON-DROWSY 240 MG Tb24 Generic drug: Pseudoephedrine HCl Stopped by: Vanna Scotland, MD     TAKE these medications   atorvastatin 40 MG tablet Commonly known as: LIPITOR Take 40 mg by mouth at bedtime.   ergocalciferol 1.25 MG (50000 UT) capsule Commonly known as: VITAMIN D2 TAKE 1 CAPSULE ORALLY WEEKLY FOR 1 YEAR   hydrOXYzine 10 MG tablet Commonly known as: ATARAX/VISTARIL Take 10 mg by mouth daily as needed.   metFORMIN 500 MG tablet Commonly known as: GLUCOPHAGE Take by mouth.   predniSONE 20 MG tablet Commonly known as: DELTASONE Take 40 mg by mouth daily with breakfast.   spironolactone 50 MG tablet Commonly known as: ALDACTONE Take 50 mg by mouth daily.       Allergies:  Allergies  Allergen Reactions  . Morphine     Family History: No family history on file.  Social History:  reports that she has been smoking. She does not have any smokeless tobacco history on file. She reports that she does not drink alcohol and does not use drugs. Her grandmother had liver cancer. No other family history of cancer.  Physical Exam: BP 140/80   LMP  (LMP Unknown)   Constitutional:  Alert and oriented, No acute distress. HEENT: [place] AT, moist mucus membranes.  Trachea midline, no masses. Cardiovascular: No clubbing, cyanosis, or edema. Respiratory: Normal respiratory effort, no increased work of breathing. Skin: No rashes, bruises or suspicious lesions. Neurologic: Grossly intact, no focal deficits, moving all 4 extremities. Psychiatric: Normal mood and affect.  Urinalysis shows many bacteria, RBC 3-10   Assessment & Plan:    1. Microscopic hematuria UA on 01/19/2020 showed 2+ hematuria on dipstick No concern for infection at time of incident UA today shows many bacteria, RBC 3-10 Discussed AUA guidelines for microscopic hematuria work-up, will  relatively low risk given primarily her age but she is a smoker and thus it is reasonable to pursue work-up at this point in time Non contrast CT on 01/20/2020 is reassuring Discussed a cystoscopy to evaluate. Patient agreed.  RUS to rule out any occult upper tract lesions   [facility] [address], [address] [place], [place] [address] [phone]  I, Theador Hawthorne, am acting as a scribe for Dr. Vanna Scotland.  I have reviewed the above documentation for accuracy and completeness, and I agree with the above.   Vanna Scotland, MD

## 2020-02-04 ENCOUNTER — Encounter: Payer: Self-pay | Admitting: Urology

## 2020-02-04 ENCOUNTER — Encounter (INDEPENDENT_AMBULATORY_CARE_PROVIDER_SITE_OTHER): Payer: Self-pay

## 2020-02-04 ENCOUNTER — Other Ambulatory Visit: Payer: Self-pay

## 2020-02-04 ENCOUNTER — Ambulatory Visit (INDEPENDENT_AMBULATORY_CARE_PROVIDER_SITE_OTHER): Payer: Medicaid Other | Admitting: Urology

## 2020-02-04 VITALS — BP 140/80

## 2020-02-04 DIAGNOSIS — R319 Hematuria, unspecified: Secondary | ICD-10-CM | POA: Diagnosis not present

## 2020-02-04 NOTE — Patient Instructions (Signed)
Cystoscopy Cystoscopy is a procedure that is used to help diagnose and sometimes treat conditions that affect the lower urinary tract. The lower urinary tract includes the bladder and the urethra. The urethra is the tube that drains urine from the bladder. Cystoscopy is done using a thin, tube-shaped instrument with a light and camera at the end (cystoscope). The cystoscope may be hard or flexible, depending on the goal of the procedure. The cystoscope is inserted through the urethra, into the bladder. Cystoscopy may be recommended if you have:  Urinary tract infections that keep coming back.  Blood in the urine (hematuria).  An inability to control when you urinate (urinary incontinence) or an overactive bladder.  Unusual cells found in a urine sample.  A blockage in the urethra, such as a urinary stone.  Painful urination.  An abnormality in the bladder found during an intravenous pyelogram (IVP) or CT scan. Cystoscopy may also be done to remove a sample of tissue to be examined under a microscope (biopsy). What are the risks? Generally, this is a safe procedure. However, problems may occur, including:  Infection.  Bleeding.  What happens during the procedure?  1. You will be given one or more of the following: ? A medicine to numb the area (local anesthetic). 2. The area around the opening of your urethra will be cleaned. 3. The cystoscope will be passed through your urethra into your bladder. 4. Germ-free (sterile) fluid will flow through the cystoscope to fill your bladder. The fluid will stretch your bladder so that your health care provider can clearly examine your bladder walls. 5. Your doctor will look at the urethra and bladder. 6. The cystoscope will be removed The procedure may vary among health care providers  What can I expect after the procedure? After the procedure, it is common to have: 1. Some soreness or pain in your abdomen and urethra. 2. Urinary symptoms.  These include: ? Mild pain or burning when you urinate. Pain should stop within a few minutes after you urinate. This may last for up to 1 week. ? A small amount of blood in your urine for several days. ? Feeling like you need to urinate but producing only a small amount of urine. Follow these instructions at home: General instructions  Return to your normal activities as told by your health care provider.   Do not drive for 24 hours if you were given a sedative during your procedure.  Watch for any blood in your urine. If the amount of blood in your urine increases, call your health care provider.  If a tissue sample was removed for testing (biopsy) during your procedure, it is up to you to get your test results. Ask your health care provider, or the department that is doing the test, when your results will be ready.  Drink enough fluid to keep your urine pale yellow.  Keep all follow-up visits as told by your health care provider. This is important. Contact a health care provider if you:  Have pain that gets worse or does not get better with medicine, especially pain when you urinate.  Have trouble urinating.  Have more blood in your urine. Get help right away if you:  Have blood clots in your urine.  Have abdominal pain.  Have a fever or chills.  Are unable to urinate. Summary  Cystoscopy is a procedure that is used to help diagnose and sometimes treat conditions that affect the lower urinary tract.  Cystoscopy is done using   a thin, tube-shaped instrument with a light and camera at the end.  After the procedure, it is common to have some soreness or pain in your abdomen and urethra.  Watch for any blood in your urine. If the amount of blood in your urine increases, call your health care provider.  If you were prescribed an antibiotic medicine, take it as told by your health care provider. Do not stop taking the antibiotic even if you start to feel better. This  information is not intended to replace advice given to you by your health care provider. Make sure you discuss any questions you have with your health care provider. Document Revised: 07/02/2018 Document Reviewed: 07/02/2018 Elsevier Patient Education  2020 [employer]   

## 2020-02-05 LAB — URINALYSIS, COMPLETE
Bilirubin, UA: NEGATIVE
Glucose, UA: NEGATIVE
Leukocytes,UA: NEGATIVE
Nitrite, UA: NEGATIVE
Specific Gravity, UA: >1.03 — ABNORMAL HIGH (ref 1.005–1.030)
Urobilinogen, Ur: 0.2 mg/dL (ref 0.2–1.0)
pH, UA: 5 (ref 5.0–7.5)

## 2020-02-05 LAB — MICROSCOPIC EXAMINATION: Epithelial Cells (non renal): >10 /hpf — AB (ref 0–10)

## 2020-02-11 ENCOUNTER — Ambulatory Visit
Admission: RE | Admit: 2020-02-11 | Discharge: 2020-02-11 | Disposition: A | Discharge status: Home / Self Care | Payer: Medicaid Other | Source: Ambulatory Visit | Attending: Urology | Admitting: Urology

## 2020-02-11 ENCOUNTER — Other Ambulatory Visit: Payer: Self-pay

## 2020-02-11 DIAGNOSIS — R319 Hematuria, unspecified: Secondary | ICD-10-CM

## 2020-02-12 ENCOUNTER — Telehealth: Payer: Self-pay | Admitting: *Deleted

## 2020-02-12 NOTE — Telephone Encounter (Addendum)
Left patient a VM with details, asked to return call with any questions.   ----- Message from Vanna Scotland, MD sent at 02/12/2020  8:17 AM EDT ----- Kidneys look healthy and normal on CT scan  Vanna Scotland, MD

## 2020-02-17 NOTE — Progress Notes (Signed)
   02/18/2020  CC:  Chief Complaint  Patient presents with  . Cysto    HPI: Meghan Spencer is a 34 y.o. female with microscopic hematuria returns today for a cystoscopy and review of RUS.   She had a follow up with Orson Eva, NP on 01/19/2020. She had left sided flank pain radiating to her left buttock and down her left leg x 1 week. She has associated numbness and tingling. During the visit she noted that the flank pain had lessened. She was not on her menstrual cycle at the time. She did ave sexual activity 2 days prior to appointment. She has chronic lower back pain, seeing chiropractor.   UA showed 2+ hematuria on dipstick. No microscopic examination was provided.  CT A/P without contrast on 01/20/2020 showed hepatic steatosis. Umbilical hernia. Normal kidneys. Report is available in referral but I am unable to access imaging elsewhere.   RUS on 02/11/2020 showed normal ultrasound appearance of the kidneys. Liver appeared slightly echogenic as may be seen with steatosis, suggest correlation with LFTs.  She is a current smoker. Patient smokes 1-2 cigarettes a day.  Her grandmother had liver cancer. No other family history of cancer.   She has a history of PCOS. She has some discomfort from time to time.    Blood pressure (!) 130/85, pulse 84. NED. A&Ox3.   No respiratory distress   Abd soft, NT, ND Normal external genitalia with patent urethral meatus  Cystoscopy Procedure Note  Patient identification was confirmed, informed consent was obtained, and patient was prepped using Betadine solution.  Lidocaine jelly was administered per urethral meatus.    Procedure: - Flexible cystoscope introduced, without any difficulty.   - Thorough search of the bladder revealed:    normal urethral meatus    normal urothelium    no stones    no ulcers     no tumors    no urethral polyps    no trabeculation  - Ureteral orifices were normal in position and  appearance.  Post-Procedure: - Patient tolerated the procedure well  Urinalysis  Shows 3-10 RBC, otherwise negative  Assessment/ Plan:  1. Microscopic hematuria  UA shows 3-10 RBC, otherwise negative. Cysto shows no evidence of disease. Upper tract imaging is reassuring. Recommend persistent microscopic hematuria (if persistent) to be evaluated every 2-3 years and she can be followed by her PCP. Patient was made aware.   Return if symptoms worsen or fail to improve.  Georges Mouse, am acting as a scribe for Dr. Vanna Scotland.  I have reviewed the above documentation for accuracy and completeness, and I agree with the above.   Vanna Scotland, MD

## 2020-02-18 ENCOUNTER — Other Ambulatory Visit: Payer: Self-pay

## 2020-02-18 ENCOUNTER — Ambulatory Visit (INDEPENDENT_AMBULATORY_CARE_PROVIDER_SITE_OTHER): Payer: Medicaid Other | Admitting: Urology

## 2020-02-18 VITALS — BP 130/85 | HR 84

## 2020-02-18 DIAGNOSIS — R319 Hematuria, unspecified: Secondary | ICD-10-CM | POA: Diagnosis not present

## 2020-02-19 LAB — URINALYSIS, COMPLETE
Bilirubin, UA: NEGATIVE
Glucose, UA: NEGATIVE
Ketones, UA: NEGATIVE
Leukocytes,UA: NEGATIVE
Nitrite, UA: NEGATIVE
Protein,UA: NEGATIVE
Specific Gravity, UA: 1.02 (ref 1.005–1.030)
Urobilinogen, Ur: 0.2 mg/dL (ref 0.2–1.0)
pH, UA: 7 (ref 5.0–7.5)

## 2020-02-19 LAB — MICROSCOPIC EXAMINATION

## 2020-02-20 ENCOUNTER — Other Ambulatory Visit: Payer: Self-pay | Admitting: Nurse Practitioner

## 2020-02-20 DIAGNOSIS — M5442 Lumbago with sciatica, left side: Secondary | ICD-10-CM

## 2020-02-25 ENCOUNTER — Other Ambulatory Visit: Payer: Self-pay | Admitting: Urology

## 2020-03-07 ENCOUNTER — Ambulatory Visit
Admission: RE | Admit: 2020-03-07 | Discharge: 2020-03-07 | Disposition: A | Discharge status: Home / Self Care | Payer: Medicaid Other | Source: Ambulatory Visit | Attending: Nurse Practitioner | Admitting: Nurse Practitioner

## 2020-03-07 ENCOUNTER — Encounter: Payer: Self-pay | Admitting: Radiology

## 2020-03-07 DIAGNOSIS — M5442 Lumbago with sciatica, left side: Secondary | ICD-10-CM | POA: Insufficient documentation

## 2020-03-09 ENCOUNTER — Other Ambulatory Visit: Payer: Self-pay | Admitting: Nurse Practitioner

## 2020-03-09 DIAGNOSIS — S32010A Wedge compression fracture of first lumbar vertebra, initial encounter for closed fracture: Secondary | ICD-10-CM

## 2020-03-25 ENCOUNTER — Ambulatory Visit: Payer: Medicaid Other

## 2021-02-04 ENCOUNTER — Other Ambulatory Visit: Payer: Self-pay | Admitting: Nurse Practitioner

## 2021-02-04 DIAGNOSIS — N644 Mastodynia: Secondary | ICD-10-CM

## 2021-02-14 ENCOUNTER — Other Ambulatory Visit: Payer: Self-pay | Admitting: Nurse Practitioner

## 2021-02-14 DIAGNOSIS — N644 Mastodynia: Secondary | ICD-10-CM

## 2021-03-04 ENCOUNTER — Ambulatory Visit
Admission: RE | Admit: 2021-03-04 | Discharge: 2021-03-04 | Disposition: A | Discharge status: Home / Self Care | Payer: Medicaid Other | Source: Ambulatory Visit | Attending: Nurse Practitioner | Admitting: Nurse Practitioner

## 2021-03-04 ENCOUNTER — Other Ambulatory Visit: Payer: Self-pay | Admitting: Nurse Practitioner

## 2021-03-04 ENCOUNTER — Other Ambulatory Visit: Payer: Self-pay

## 2021-03-04 DIAGNOSIS — N644 Mastodynia: Secondary | ICD-10-CM | POA: Diagnosis not present

## 2021-03-04 DIAGNOSIS — N6310 Unspecified lump in the right breast, unspecified quadrant: Secondary | ICD-10-CM

## 2021-03-07 ENCOUNTER — Other Ambulatory Visit: Payer: Self-pay | Admitting: Nurse Practitioner

## 2021-03-07 DIAGNOSIS — R928 Other abnormal and inconclusive findings on diagnostic imaging of breast: Secondary | ICD-10-CM

## 2021-03-07 DIAGNOSIS — N6489 Other specified disorders of breast: Secondary | ICD-10-CM

## 2021-03-09 ENCOUNTER — Other Ambulatory Visit: Payer: Self-pay | Admitting: Nurse Practitioner

## 2021-03-09 DIAGNOSIS — N6489 Other specified disorders of breast: Secondary | ICD-10-CM

## 2021-03-09 DIAGNOSIS — R928 Other abnormal and inconclusive findings on diagnostic imaging of breast: Secondary | ICD-10-CM

## 2021-03-17 ENCOUNTER — Other Ambulatory Visit: Payer: Self-pay

## 2021-03-17 ENCOUNTER — Ambulatory Visit
Admission: RE | Admit: 2021-03-17 | Discharge: 2021-03-17 | Disposition: A | Discharge status: Home / Self Care | Payer: Medicaid Other | Source: Ambulatory Visit | Attending: Nurse Practitioner | Admitting: Nurse Practitioner

## 2021-03-17 DIAGNOSIS — R928 Other abnormal and inconclusive findings on diagnostic imaging of breast: Secondary | ICD-10-CM

## 2021-03-17 DIAGNOSIS — N6489 Other specified disorders of breast: Secondary | ICD-10-CM

## 2021-03-17 HISTORY — PX: BREAST BIOPSY: SHX20

## 2021-03-18 LAB — SURGICAL PATHOLOGY

## 2022-09-29 ENCOUNTER — Other Ambulatory Visit: Payer: Self-pay | Admitting: Nurse Practitioner

## 2022-11-03 ENCOUNTER — Other Ambulatory Visit: Payer: Self-pay | Admitting: Nurse Practitioner

## 2022-11-06 ENCOUNTER — Other Ambulatory Visit: Payer: Self-pay | Admitting: Nurse Practitioner

## 2022-11-06 MED ORDER — CLONAZEPAM 0.5 MG PO TABS: ORAL_TABLET | ORAL | 1 refills | Status: DC

## 2022-12-27 ENCOUNTER — Other Ambulatory Visit: Payer: Self-pay | Admitting: Nurse Practitioner

## 2023-01-13 ENCOUNTER — Other Ambulatory Visit: Payer: Self-pay | Admitting: Family

## 2023-02-16 ENCOUNTER — Other Ambulatory Visit: Payer: Self-pay | Admitting: Nurse Practitioner

## 2023-05-11 ENCOUNTER — Ambulatory Visit: Payer: Medicaid Other | Admitting: Cardiology

## 2023-05-11 ENCOUNTER — Encounter: Payer: Self-pay | Admitting: Cardiology

## 2023-05-11 VITALS — BP 115/70 | HR 65 | Ht 64.0 in | Wt 253.4 lb

## 2023-05-11 DIAGNOSIS — E559 Vitamin D deficiency, unspecified: Secondary | ICD-10-CM | POA: Insufficient documentation

## 2023-05-11 DIAGNOSIS — Z1329 Encounter for screening for other suspected endocrine disorder: Secondary | ICD-10-CM | POA: Diagnosis not present

## 2023-05-11 DIAGNOSIS — R519 Headache, unspecified: Secondary | ICD-10-CM

## 2023-05-11 DIAGNOSIS — E782 Mixed hyperlipidemia: Secondary | ICD-10-CM | POA: Diagnosis not present

## 2023-05-11 DIAGNOSIS — E119 Type 2 diabetes mellitus without complications: Secondary | ICD-10-CM | POA: Diagnosis not present

## 2023-05-11 NOTE — Progress Notes (Signed)
Established Patient Office Visit  Subjective:  Patient ID: Meghan Spencer, female    DOB: Dec 21, 1985  Age: 37 y.o. MRN: [id number]  Chief Complaint  Patient presents with   Follow-up    F/U    Patient in office for regular follow up. Has not been seen since 12/2021. Patient reports current migraine going on day 3, yesterday being the worst day. States she took a muscle relaxer last night which gave her some relief. Prior to today, patient has taken ibuprofen, tylenol and Fioricet. Patient given samples of Ubrelvy or Nurtec in the past but did not try them. Will give additional samples today for her to trial. Patient requesting to be tested for Lupus because of headaches, joint pain and frequent sores in her nose. Will order lab work today.  Patient wanting to wean off Celexa. Will start cutting in half.  Patient fasting today, will order labs.  History of vitamin d deficiency, will check vitamin D level.  Last pap smear 01/13/2022.     No other concerns at this time.   Past Medical History:  Diagnosis Date   Asthma    Depression    Migraine    Obesity    Ovarian cyst    PCOS (polycystic ovarian syndrome)    Tobacco use     Past Surgical History:  Procedure Laterality Date   BREAST BIOPSY Right 03/17/2021   stereo biopsy/ ribbon clip/ path pending   CESAREAN SECTION     CESAREAN SECTION      Social History   Socioeconomic History   Marital status: Married    Spouse name: Not on file   Number of children: Not on file   Years of education: Not on file   Highest education level: Not on file  Occupational History   Not on file  Tobacco Use   Smoking status: Every Day   Smokeless tobacco: Not on file  Substance and Sexual Activity   Alcohol use: No   Drug use: No   Sexual activity: Not on file  Other Topics Concern   Not on file  Social History Narrative   Not on file   Social Determinants of Health   Financial Resource Strain: Not on file  Food  Insecurity: Not on file  Transportation Needs: Not on file  Physical Activity: Not on file  Stress: Not on file  Social Connections: Not on file  Intimate Partner Violence: Not on file    History reviewed. No pertinent family history.  Allergies  Allergen Reactions   Morphine     Review of Systems  Constitutional: Negative.   HENT: Negative.    Eyes: Negative.   Respiratory: Negative.  Negative for shortness of breath.   Cardiovascular: Negative.  Negative for chest pain.  Gastrointestinal: Negative.  Negative for abdominal pain, constipation and diarrhea.  Genitourinary: Negative.   Musculoskeletal:  Positive for joint pain. Negative for myalgias.  Skin: Negative.   Neurological:  Positive for headaches. Negative for dizziness.  Endo/Heme/Allergies: Negative.   All other systems reviewed and are negative.      Objective:   BP 115/70   Pulse 65   Ht 5\' 4"  (1.626 m)   Wt 253 lb 6.4 oz (114.9 kg)   SpO2 98%   BMI 43.50 kg/m   Vitals:   05/11/23 0917  BP: 115/70  Pulse: 65  Height: 5\' 4"  (1.626 m)  Weight: 253 lb 6.4 oz (114.9 kg)  SpO2: 98%  BMI (Calculated): 43.47  Physical Exam Vitals and nursing note reviewed.  Constitutional:      Appearance: Normal appearance. She is normal weight.  HENT:     Head: Normocephalic and atraumatic.     Nose: Nose normal.     Mouth/Throat:     Mouth: Mucous membranes are moist.  Eyes:     Extraocular Movements: Extraocular movements intact.     Conjunctiva/sclera: Conjunctivae normal.     Pupils: Pupils are equal, round, and reactive to light.  Cardiovascular:     Rate and Rhythm: Normal rate and regular rhythm.     Pulses: Normal pulses.     Heart sounds: Normal heart sounds.  Pulmonary:     Effort: Pulmonary effort is normal.     Breath sounds: Normal breath sounds.  Abdominal:     General: Abdomen is flat. Bowel sounds are normal.     Palpations: Abdomen is soft.  Musculoskeletal:        General: Normal  range of motion.     Cervical back: Normal range of motion.  Skin:    General: Skin is warm and dry.  Neurological:     General: No focal deficit present.     Mental Status: She is alert and oriented to person, place, and time.  Psychiatric:        Mood and Affect: Mood normal.        Behavior: Behavior normal.        Thought Content: Thought content normal.        Judgment: Judgment normal.      No results found for any visits on 05/11/23.  No results found for this or any previous visit (from the past 2160 hour(s)).    Assessment & Plan:  Fasting labs today. Labs today to rule out Lupus. Wean off of Celexa. Trial of Ubrelvy or Nurtec.   Problem List Items Addressed This Visit       Endocrine   Diabetes mellitus without complication (HCC)   Relevant Orders   CMP14+EGFR   Hemoglobin A1c   CBC with Differential/Platelet     Other   Thyroid disorder screening - Primary   Relevant Orders   TSH   CBC with Differential/Platelet   Mixed hyperlipidemia   Relevant Orders   Lipid Profile   CBC with Differential/Platelet   Vitamin D deficiency   Relevant Orders   Vitamin D (25 hydroxy)    Return in about 4 weeks (around 06/08/2023).   Total time spent: 35 minutes  Edwardine Deschepper, NP  05/11/2023   This document may have been prepared by Dragon Voice Recognition software and as such may include unintentional dictation errors.

## 2023-05-12 LAB — CMP14+EGFR
ALT: 18 [IU]/L (ref 0–32)
AST: 12 [IU]/L (ref 0–40)
Albumin: 4.4 g/dL (ref 3.9–4.9)
Alkaline Phosphatase: 53 [IU]/L (ref 44–121)
BUN/Creatinine Ratio: 13 (ref 9–23)
BUN: 9 mg/dL (ref 6–20)
Bilirubin Total: 0.3 mg/dL (ref 0.0–1.2)
CO2: 19 mmol/L — ABNORMAL LOW (ref 20–29)
Calcium: 9.5 mg/dL (ref 8.7–10.2)
Chloride: 104 mmol/L (ref 96–106)
Creatinine, Ser: 0.67 mg/dL (ref 0.57–1.00)
Globulin, Total: 2.2 g/dL (ref 1.5–4.5)
Glucose: 94 mg/dL (ref 70–99)
Potassium: 4.3 mmol/L (ref 3.5–5.2)
Sodium: 139 mmol/L (ref 134–144)
Total Protein: 6.6 g/dL (ref 6.0–8.5)
eGFR: 115 mL/min/{1.73_m2} (ref >59)

## 2023-05-12 LAB — CBC WITH DIFFERENTIAL/PLATELET
Basophils Absolute: 0.1 10*3/uL (ref 0.0–0.2)
Basos: 1 %
EOS (ABSOLUTE): 0.2 10*3/uL (ref 0.0–0.4)
Eos: 2 %
Hematocrit: 38.4 % (ref 34.0–46.6)
Hemoglobin: 12.7 g/dL (ref 11.1–15.9)
Immature Grans (Abs): 0 10*3/uL (ref 0.0–0.1)
Immature Granulocytes: 0 %
Lymphocytes Absolute: 2.9 10*3/uL (ref 0.7–3.1)
Lymphs: 40 %
MCH: 29 pg (ref 26.6–33.0)
MCHC: 33.1 g/dL (ref 31.5–35.7)
MCV: 88 fL (ref 79–97)
Monocytes Absolute: 0.4 10*3/uL (ref 0.1–0.9)
Monocytes: 6 %
Neutrophils Absolute: 3.7 10*3/uL (ref 1.4–7.0)
Neutrophils: 51 %
Platelets: 320 10*3/uL (ref 150–450)
RBC: 4.38 x10E6/uL (ref 3.77–5.28)
RDW: 12.4 % (ref 11.7–15.4)
WBC: 7.3 10*3/uL (ref 3.4–10.8)

## 2023-05-12 LAB — HEMOGLOBIN A1C
Est. average glucose Bld gHb Est-mCnc: 123 mg/dL
Hgb A1c MFr Bld: 5.9 % — ABNORMAL HIGH (ref 4.8–5.6)

## 2023-05-12 LAB — LIPID PANEL
Chol/HDL Ratio: 3.4 {ratio} (ref 0.0–4.4)
Cholesterol, Total: 136 mg/dL (ref 100–199)
HDL: 40 mg/dL (ref >39)
LDL Chol Calc (NIH): 78 mg/dL (ref 0–99)
Triglycerides: 95 mg/dL (ref 0–149)
VLDL Cholesterol Cal: 18 mg/dL (ref 5–40)

## 2023-05-12 LAB — TSH: TSH: 1.63 u[IU]/mL (ref 0.450–4.500)

## 2023-05-12 LAB — ANA: Anti Nuclear Antibody (ANA): NEGATIVE

## 2023-05-12 LAB — SEDIMENTATION RATE: Sed Rate: 10 mm/h (ref 0–32)

## 2023-05-12 LAB — RHEUMATOID FACTOR: Rheumatoid fact SerPl-aCnc: <10 [IU]/mL (ref <14.0)

## 2023-05-12 LAB — VITAMIN D 25 HYDROXY (VIT D DEFICIENCY, FRACTURES): Vit D, 25-Hydroxy: 28.2 ng/mL — ABNORMAL LOW (ref 30.0–100.0)

## 2023-05-13 ENCOUNTER — Other Ambulatory Visit: Payer: Self-pay | Admitting: Family

## 2023-05-13 MED ORDER — BUTALBITAL-ASPIRIN-CAFFEINE 50-325-40 MG PO CAPS: 1.0000 | ORAL_CAPSULE | Freq: Four times a day (QID) | ORAL | 3 refills | Status: DC | PRN

## 2023-05-14 ENCOUNTER — Other Ambulatory Visit: Payer: Self-pay | Admitting: Cardiology

## 2023-05-14 MED ORDER — VITAMIN D (ERGOCALCIFEROL) 1.25 MG (50000 UNIT) PO CAPS: 50000.0000 [IU] | ORAL_CAPSULE | ORAL | 4 refills | Status: AC

## 2023-05-14 NOTE — Progress Notes (Signed)
Patient Notified via MyChart Messgae

## 2023-06-11 ENCOUNTER — Ambulatory Visit: Payer: Medicaid Other | Admitting: Cardiology

## 2023-08-18 IMAGING — MG MM BREAST BX W/ LOC DEV 1ST LESION IMAGE BX SPEC STEREO GUIDE*R*
8 of 11 series · 8 of 23 positions shown · non-contrast
Comparison: Previous exams.
COMPARISON: Previous exams.

Addendum:
CLINICAL DATA: Right breast asymmetry.

EXAM:
RIGHT BREAST STEREOTACTIC CORE NEEDLE BIOPSY

[R (1 of 6)]
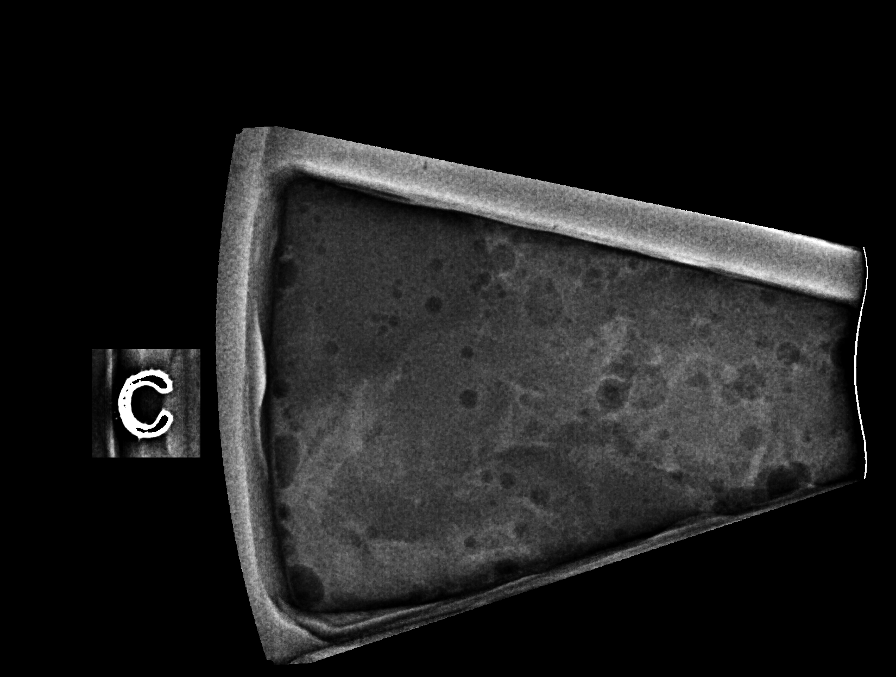

[R (2 of 6)]
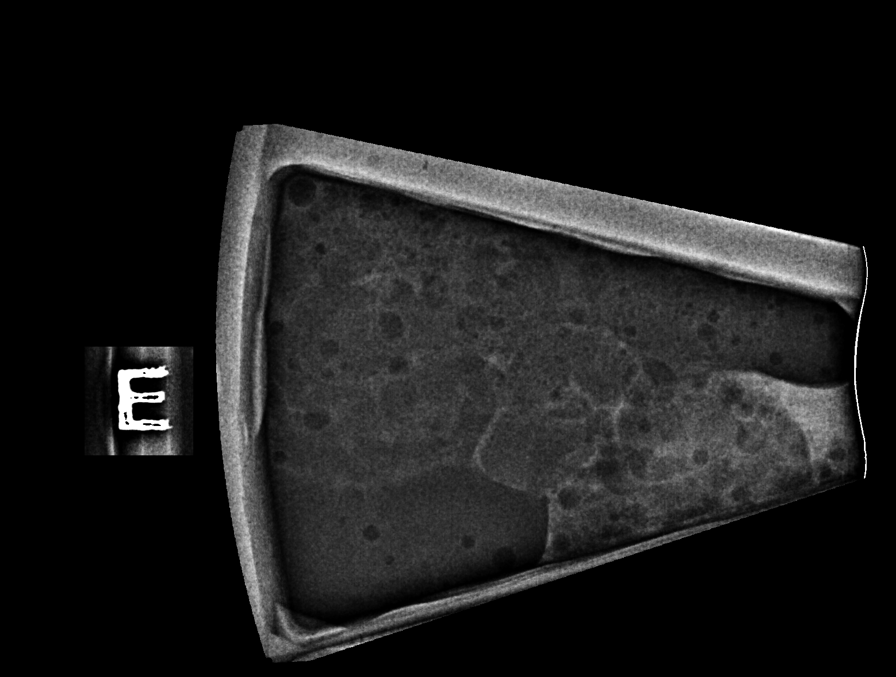

[R (3 of 6)]
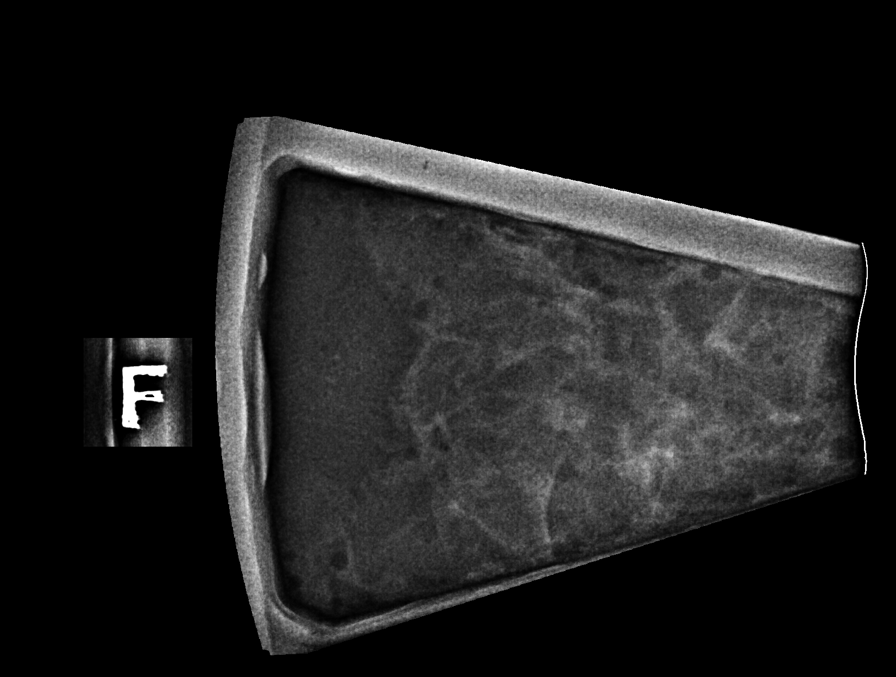

[R (4 of 6)]
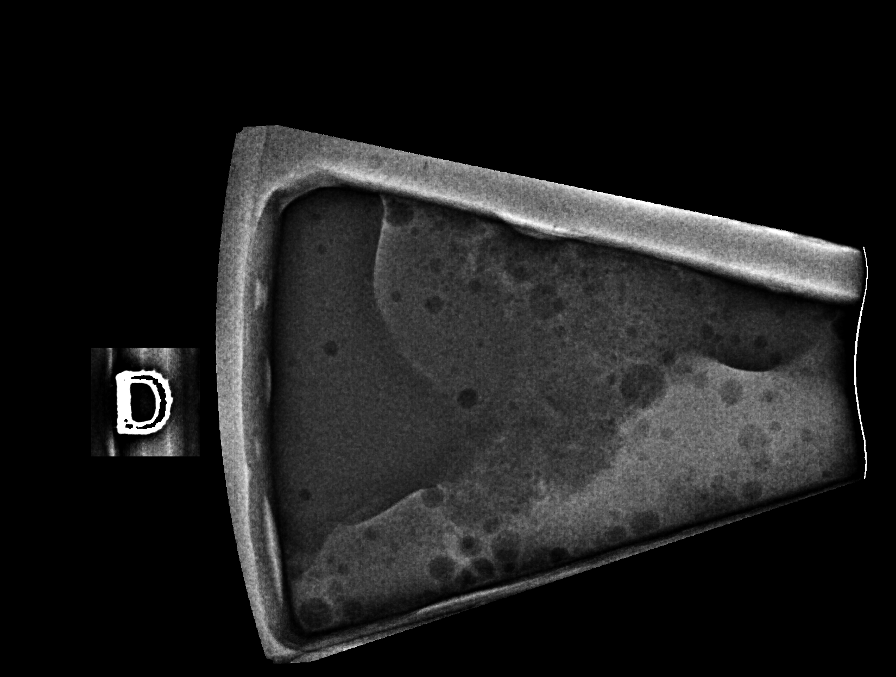

[R (5 of 6)]
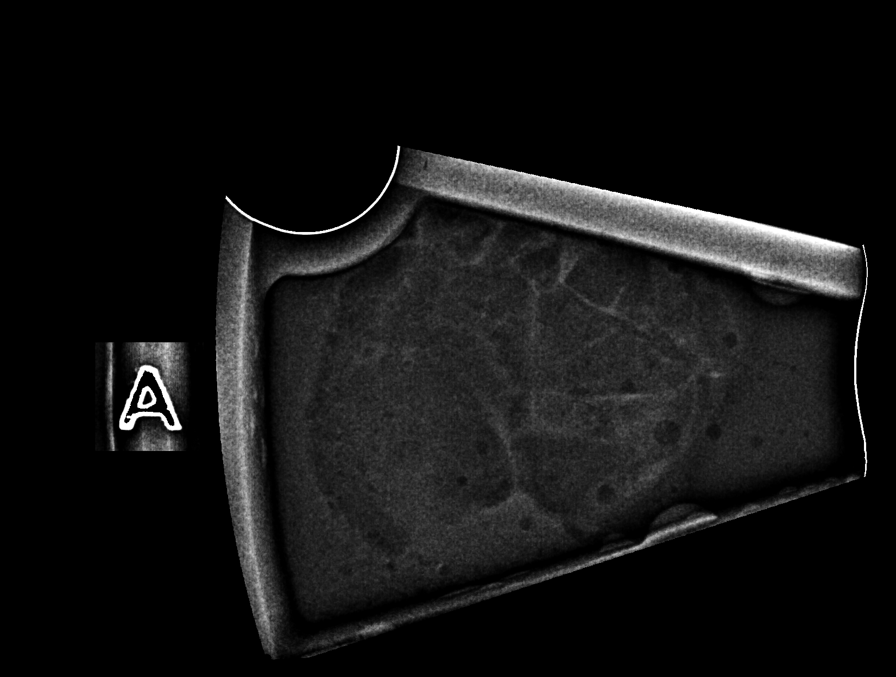

[R (6 of 6)]
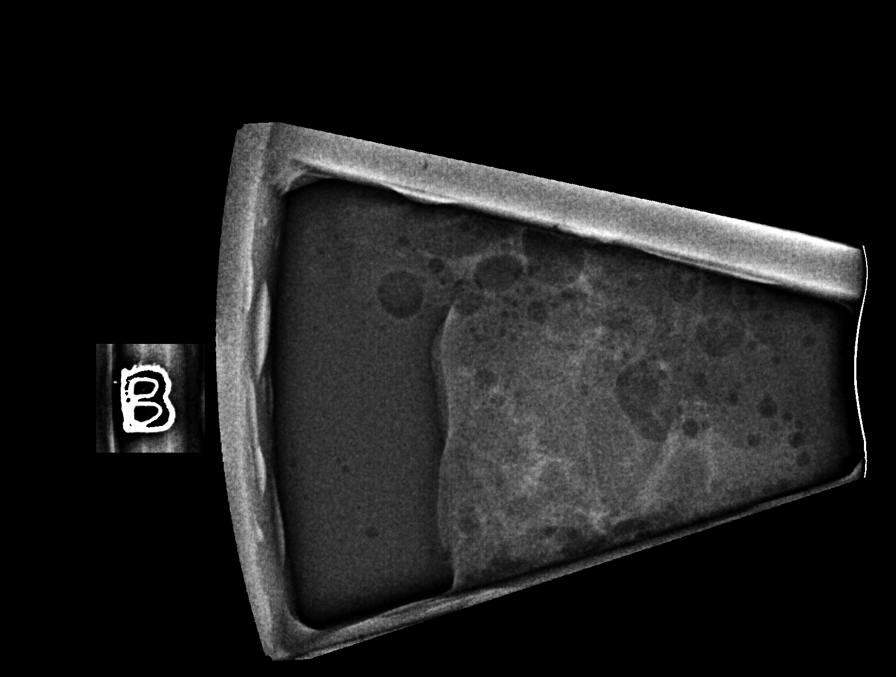

[R CC (1 of 2)]
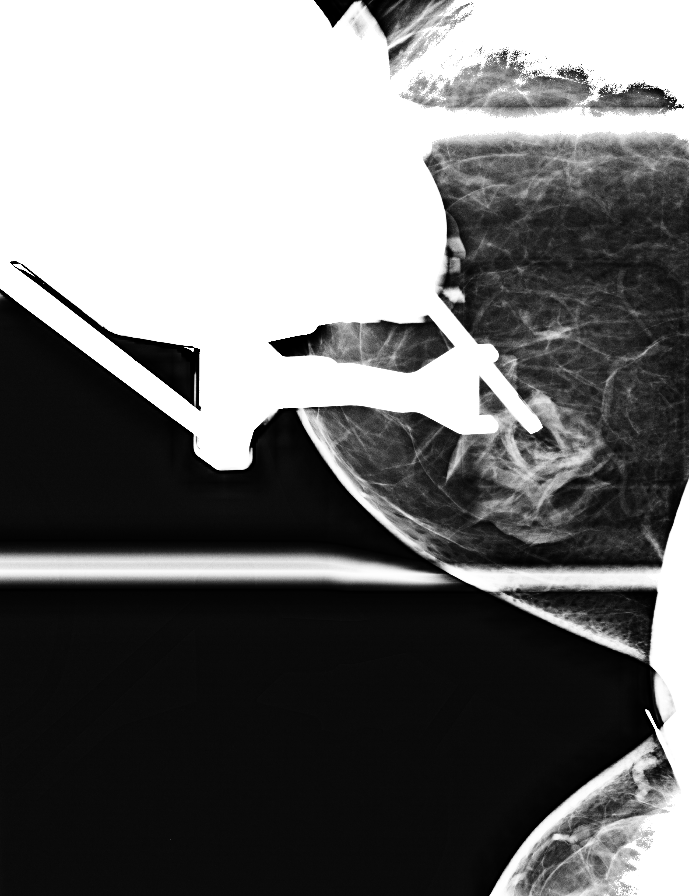

[R CC (2 of 2)]
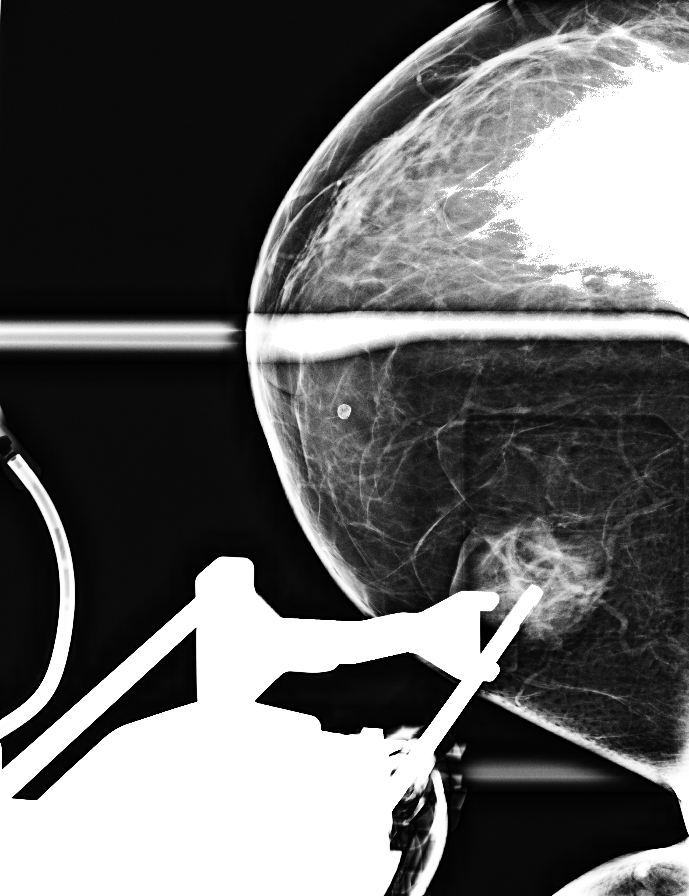

[8 of 23 positions shown; findings below may reference images not displayed]



Using sterile technique and 1% Lidocaine as local anesthetic, under
stereotactic guidance, a 9 gauge vacuum assisted device was used to
perform core needle biopsy of the focal asymmetry in upper inner
right breast using a superior approach.

Lesion quadrant: Upper inner quadrant

At the conclusion of the procedure, a ribbon shaped tissue marker
clip was deployed into the biopsy cavity. Follow-up 2-view mammogram
was performed and dictated separately.
IMPRESSION: Stereotactic-guided biopsy of a focal asymmetry in the upper inner
right breast. No apparent complications.

ADDENDUM:
PATHOLOGY revealed: A. BREAST, RIGHT UPPER INNER QUADRANT;
STEREOTACTIC CORE NEEDLE BIOPSY: - BENIGN MAMMARY PARENCHYMA WITH
MODERATE STROMAL FIBROSIS AND COLUMNAR CELL CHANGE WITHOUT ATYPIA. -
NEGATIVE FOR ATYPICAL PROLIFERATIVE BREAST DISEASE.

Pathology results are CONCORDANT with imaging findings, per Dr.
Damon Biswa.

Pathology results and recommendations were discussed with patient
via telephone on 03/18/2021. Patient reported doing well after the
biopsy with no adverse symptoms, and only slight tenderness at the
site. Post biopsy care instructions were reviewed, questions were
answered and my direct phone number was provided. Patient was
instructed to call [HOSPITAL] for any additional
questions or concerns related to biopsy site.

Recommendation: Patient instructed to continue with monthly self
breast examinations, clinical follow-up as needed, and begin
bilateral screening mammogram at age 39.

Pathology results reported by Nhanha Adjoa Dickson RN on 03/18/2021.



Using sterile technique and 1% Lidocaine as local anesthetic, under
stereotactic guidance, a 9 gauge vacuum assisted device was used to
perform core needle biopsy of the focal asymmetry in upper inner
right breast using a superior approach.

Lesion quadrant: Upper inner quadrant

At the conclusion of the procedure, a ribbon shaped tissue marker
clip was deployed into the biopsy cavity. Follow-up 2-view mammogram
was performed and dictated separately.
IMPRESSION: Stereotactic-guided biopsy of a focal asymmetry in the upper inner
right breast. No apparent complications.

## 2024-01-02 ENCOUNTER — Encounter: Payer: Self-pay | Admitting: Cardiology

## 2024-01-03 ENCOUNTER — Other Ambulatory Visit: Payer: Self-pay | Admitting: Family

## 2024-01-03 ENCOUNTER — Other Ambulatory Visit: Payer: Self-pay | Admitting: Cardiology

## 2024-01-03 DIAGNOSIS — E559 Vitamin D deficiency, unspecified: Secondary | ICD-10-CM

## 2024-01-03 DIAGNOSIS — E119 Type 2 diabetes mellitus without complications: Secondary | ICD-10-CM

## 2024-01-03 DIAGNOSIS — Z1329 Encounter for screening for other suspected endocrine disorder: Secondary | ICD-10-CM

## 2024-01-03 DIAGNOSIS — Z1379 Encounter for other screening for genetic and chromosomal anomalies: Secondary | ICD-10-CM

## 2024-01-03 DIAGNOSIS — E782 Mixed hyperlipidemia: Secondary | ICD-10-CM

## 2024-01-08 ENCOUNTER — Ambulatory Visit: Admitting: Cardiology

## 2024-01-08 ENCOUNTER — Encounter: Payer: Self-pay | Admitting: Cardiology

## 2024-01-08 VITALS — BP 125/87 | HR 73 | Ht 64.0 in | Wt 242.8 lb

## 2024-01-08 DIAGNOSIS — Z6841 Body Mass Index (BMI) 40.0 and over, adult: Secondary | ICD-10-CM

## 2024-01-08 DIAGNOSIS — E119 Type 2 diabetes mellitus without complications: Secondary | ICD-10-CM | POA: Diagnosis not present

## 2024-01-08 DIAGNOSIS — E66813 Obesity, class 3: Secondary | ICD-10-CM

## 2024-01-08 DIAGNOSIS — Z0130 Encounter for examination of blood pressure without abnormal findings: Secondary | ICD-10-CM

## 2024-01-08 DIAGNOSIS — E559 Vitamin D deficiency, unspecified: Secondary | ICD-10-CM | POA: Diagnosis not present

## 2024-01-08 DIAGNOSIS — E782 Mixed hyperlipidemia: Secondary | ICD-10-CM

## 2024-01-08 DIAGNOSIS — Z1231 Encounter for screening mammogram for malignant neoplasm of breast: Secondary | ICD-10-CM | POA: Diagnosis not present

## 2024-01-08 MED ORDER — TIRZEPATIDE-WEIGHT MANAGEMENT 2.5 MG/0.5ML ~~LOC~~ SOLN: 2.5000 mg | SUBCUTANEOUS | 4 refills | Status: DC

## 2024-01-08 MED ORDER — UBRELVY 50 MG PO TABS: 1.0000 | ORAL_TABLET | ORAL | 4 refills | Status: AC | PRN

## 2024-01-08 NOTE — Progress Notes (Signed)
 Established Patient Office Visit  Subjective:  Patient ID: Meghan Spencer, female    DOB: 22-May-1986  Age: 38 y.o. MRN: [id number]  Chief Complaint  Patient presents with   Follow-up    6 month lab results    Patient in office for 6 month follow up, discuss recent lab results. Patient doing well, no complaints today.  Discussed recent lab work, LDL elevated, patient admits to not being complaint with taking her statin. Hgb A1c controlled on metformin. TSH normal. BRCA not resulted at time of visit, will call with results.  Due for mammogram, order placed.  Patient interested in weight loss options. Discussed GLP1 and bariatric surgery. Will send in Zepbound due to patient's sleep apnea. If insurance does not cover, will attempt to get Mounjaro. Referral sent to bariatric surgery for consult.    No other concerns at this time.   Past Medical History:  Diagnosis Date   Asthma    Depression    Migraine    Obesity    Ovarian cyst    PCOS (polycystic ovarian syndrome)    Tobacco use     Past Surgical History:  Procedure Laterality Date   BREAST BIOPSY Right 03/17/2021   stereo biopsy/ ribbon clip/ path pending   CESAREAN SECTION     CESAREAN SECTION      Social History   Socioeconomic History   Marital status: Married    Spouse name: Not on file   Number of children: Not on file   Years of education: Not on file   Highest education level: Not on file  Occupational History   Not on file  Tobacco Use   Smoking status: Every Day   Smokeless tobacco: Not on file  Substance and Sexual Activity   Alcohol use: No   Drug use: No   Sexual activity: Not on file  Other Topics Concern   Not on file  Social History Narrative   Not on file   Social Drivers of Health   Financial Resource Strain: Not on file  Food Insecurity: Not on file  Transportation Needs: Not on file  Physical Activity: Not on file  Stress: Not on file  Social Connections: Not on file   Intimate Partner Violence: Not on file    History reviewed. No pertinent family history.  Allergies  Allergen Reactions   Morphine      Outpatient Medications Prior to Visit  Medication Sig   clonazePAM  (KLONOPIN ) 0.5 MG tablet TAKE 1 TABLET BY MOUTH TWICE DAILY AS NEEDED FOR MOMENTS OF HIGH ANXIETY   metFORMIN (GLUCOPHAGE) 1000 MG tablet metformin 1,000 mg tablet  metFORMIN HCl 1000 MG Oral Tablet QTY: 180 tablet Days: 90 Refills: 1  Written: 10/06/19 Patient Instructions: Take 1 tablet by mouth twice daily   rosuvastatin (CRESTOR) 40 MG tablet TAKE ONE TABLET BY MOUTH ONCE DAILY **NOTE DOSE INCREASE   Vitamin D , Ergocalciferol , (DRISDOL ) 1.25 MG (50000 UNIT) CAPS capsule Take 1 capsule (50,000 Units total) by mouth every 7 (seven) days.   [DISCONTINUED] butalbital -aspirin -caffeine  (FIORINAL ) 50-325-40 MG capsule Take 1 capsule by mouth every 6 (six) hours as needed for headache. (Patient not taking: Reported on 01/08/2024)   [DISCONTINUED] citalopram (CELEXA) 40 MG tablet TAKE 1 TABLET BY MOUTH EVERYDAY AT BEDTIME (Patient not taking: Reported on 01/08/2024)   [DISCONTINUED] spironolactone (ALDACTONE) 50 MG tablet Take 50 mg by mouth daily. (Patient not taking: Reported on 01/08/2024)   No facility-administered medications prior to visit.    Review of Systems  Constitutional: Negative.   HENT: Negative.    Eyes: Negative.   Respiratory: Negative.  Negative for shortness of breath.   Cardiovascular: Negative.  Negative for chest pain.  Gastrointestinal: Negative.  Negative for abdominal pain, constipation and diarrhea.  Genitourinary: Negative.   Musculoskeletal:  Negative for joint pain and myalgias.  Skin: Negative.   Neurological: Negative.  Negative for dizziness and headaches.  Endo/Heme/Allergies: Negative.   All other systems reviewed and are negative.      Objective:   BP 125/87   Pulse 73   Ht 5' 4 (1.626 m)   Wt 242 lb 12.8 oz (110.1 kg)   SpO2 98%   BMI  41.68 kg/m   Vitals:   01/08/24 0855  BP: 125/87  Pulse: 73  Height: 5' 4 (1.626 m)  Weight: 242 lb 12.8 oz (110.1 kg)  SpO2: 98%  BMI (Calculated): 41.66    Physical Exam Vitals and nursing note reviewed.  Constitutional:      Appearance: Normal appearance. She is normal weight.  HENT:     Head: Normocephalic and atraumatic.     Nose: Nose normal.     Mouth/Throat:     Mouth: Mucous membranes are moist.   Eyes:     Extraocular Movements: Extraocular movements intact.     Conjunctiva/sclera: Conjunctivae normal.     Pupils: Pupils are equal, round, and reactive to light.    Cardiovascular:     Rate and Rhythm: Normal rate and regular rhythm.     Pulses: Normal pulses.     Heart sounds: Normal heart sounds.  Pulmonary:     Effort: Pulmonary effort is normal.     Breath sounds: Normal breath sounds.  Abdominal:     General: Abdomen is flat. Bowel sounds are normal.     Palpations: Abdomen is soft.   Musculoskeletal:        General: Normal range of motion.     Cervical back: Normal range of motion.   Skin:    General: Skin is warm and dry.   Neurological:     General: No focal deficit present.     Mental Status: She is alert and oriented to person, place, and time.   Psychiatric:        Mood and Affect: Mood normal.        Behavior: Behavior normal.        Thought Content: Thought content normal.        Judgment: Judgment normal.      No results found for any visits on 01/08/24.  No results found for this or any previous visit (from the past 2160 hours).    Assessment & Plan:  Take statin as prescribed Schedule mammogram Zepbound sent in Referral sent to bariatric surgery  Problem List Items Addressed This Visit       Endocrine   Diabetes mellitus without complication (HCC) - Primary   Relevant Orders   Amb Referral to Bariatric Surgery     Other   OBESITY, NOS   Relevant Medications   tirzepatide (ZEPBOUND) 2.5 MG/0.5ML injection vial    Other Relevant Orders   Amb Referral to Bariatric Surgery   Mixed hyperlipidemia   Vitamin D  deficiency   Other Visit Diagnoses       Breast cancer screening by mammogram       Relevant Orders   MM 3D SCREENING MAMMOGRAM BILATERAL BREAST       Return in about 6 weeks (around 02/19/2024).   Total  time spent: 25 minutes  [employer], NP  01/08/2024   This document may have been prepared by Dragon Voice Recognition software and as such may include unintentional dictation errors.

## 2024-01-09 ENCOUNTER — Encounter: Payer: Self-pay | Admitting: Cardiology

## 2024-01-09 LAB — LIPID PANEL
Chol/HDL Ratio: 7.2 ratio — ABNORMAL HIGH (ref 0.0–4.4)
Cholesterol, Total: 238 mg/dL — ABNORMAL HIGH (ref 100–199)
HDL: 33 mg/dL — ABNORMAL LOW (ref >39)
LDL Chol Calc (NIH): 182 mg/dL — ABNORMAL HIGH (ref 0–99)
Triglycerides: 125 mg/dL (ref 0–149)
VLDL Cholesterol Cal: 23 mg/dL (ref 5–40)

## 2024-01-09 LAB — HEMOGLOBIN A1C
Est. average glucose Bld gHb Est-mCnc: 114 mg/dL
Hgb A1c MFr Bld: 5.6 % (ref 4.8–5.6)

## 2024-01-09 LAB — CMP14+EGFR
ALT: 19 IU/L (ref 0–32)
AST: 12 IU/L (ref 0–40)
Albumin: 4.5 g/dL (ref 3.9–4.9)
Alkaline Phosphatase: 55 IU/L (ref 44–121)
BUN/Creatinine Ratio: 12 (ref 9–23)
BUN: 9 mg/dL (ref 6–20)
Bilirubin Total: 0.4 mg/dL (ref 0.0–1.2)
CO2: 23 mmol/L (ref 20–29)
Calcium: 9.7 mg/dL (ref 8.7–10.2)
Chloride: 103 mmol/L (ref 96–106)
Creatinine, Ser: 0.77 mg/dL (ref 0.57–1.00)
Globulin, Total: 2.2 g/dL (ref 1.5–4.5)
Glucose: 94 mg/dL (ref 70–99)
Potassium: 4.4 mmol/L (ref 3.5–5.2)
Sodium: 140 mmol/L (ref 134–144)
Total Protein: 6.7 g/dL (ref 6.0–8.5)
eGFR: 101 mL/min/{1.73_m2} (ref >59)

## 2024-01-09 LAB — VITAMIN D 25 HYDROXY (VIT D DEFICIENCY, FRACTURES): Vit D, 25-Hydroxy: 26.5 ng/mL — ABNORMAL LOW (ref 30.0–100.0)

## 2024-01-09 LAB — REQUEST PROBLEM

## 2024-01-09 LAB — TSH: TSH: 1.52 u[IU]/mL (ref 0.450–4.500)

## 2024-01-10 ENCOUNTER — Ambulatory Visit: Payer: Self-pay

## 2024-01-14 ENCOUNTER — Other Ambulatory Visit: Payer: Self-pay | Admitting: Family

## 2024-01-14 ENCOUNTER — Encounter: Payer: Self-pay | Admitting: Cardiology

## 2024-01-14 MED ORDER — CLONAZEPAM 0.5 MG PO TABS: ORAL_TABLET | ORAL | 1 refills | Status: AC

## 2024-02-21 ENCOUNTER — Telehealth: Payer: Self-pay

## 2024-02-21 ENCOUNTER — Encounter: Payer: Self-pay | Admitting: Cardiology

## 2024-02-21 ENCOUNTER — Other Ambulatory Visit: Payer: Self-pay | Admitting: Cardiology

## 2024-02-21 MED ORDER — WEGOVY 0.25 MG/0.5ML ~~LOC~~ SOAJ: 0.2500 mg | SUBCUTANEOUS | 3 refills | Status: AC

## 2024-02-21 NOTE — Telephone Encounter (Signed)
 Pt called to cancel appt tomorrow, 8/1- she asked if there's an alternative rx for zepbound  that can be sent? Mentioned insurance isn't wanting to cover zepbound , please advise

## 2024-02-21 NOTE — Telephone Encounter (Signed)
Left vm informing pt

## 2024-02-22 ENCOUNTER — Ambulatory Visit: Admitting: Cardiology

## 2024-05-28 ENCOUNTER — Encounter: Payer: Self-pay | Admitting: Cardiology

## 2024-05-31 ENCOUNTER — Other Ambulatory Visit: Payer: Self-pay | Admitting: Cardiology
